# Patient Record
Sex: Male | Born: 1990 | Race: Black or African American | Hispanic: No | Smoking: Current some day smoker
Health system: Southern US, Community
[De-identification: ages and names within clinical notes are randomized; demographics above are authoritative.]

---

## 2015-02-21 ENCOUNTER — Emergency Department (HOSPITAL_COMMUNITY): Payer: Self-pay

## 2015-02-21 ENCOUNTER — Encounter (HOSPITAL_COMMUNITY): Payer: Self-pay | Admitting: *Deleted

## 2015-02-21 ENCOUNTER — Observation Stay (HOSPITAL_COMMUNITY): Payer: Self-pay

## 2015-02-21 ENCOUNTER — Emergency Department (HOSPITAL_COMMUNITY): Payer: MEDICAID

## 2015-02-21 ENCOUNTER — Inpatient Hospital Stay (HOSPITAL_COMMUNITY)
Admission: EM | Admit: 2015-02-21 | Discharge: 2015-02-27 | DRG: 089 | Disposition: A | Discharge status: Other Acute Inpatient Hospital | Payer: Self-pay | Attending: General Surgery | Admitting: General Surgery

## 2015-02-21 DIAGNOSIS — M542 Cervicalgia: Secondary | ICD-10-CM

## 2015-02-21 DIAGNOSIS — R402362 Coma scale, best motor response, obeys commands, at arrival to emergency department: Secondary | ICD-10-CM | POA: Diagnosis present

## 2015-02-21 DIAGNOSIS — S0990XA Unspecified injury of head, initial encounter: Secondary | ICD-10-CM

## 2015-02-21 DIAGNOSIS — S81012A Laceration without foreign body, left knee, initial encounter: Secondary | ICD-10-CM | POA: Diagnosis present

## 2015-02-21 DIAGNOSIS — S20211A Contusion of right front wall of thorax, initial encounter: Secondary | ICD-10-CM

## 2015-02-21 DIAGNOSIS — R52 Pain, unspecified: Secondary | ICD-10-CM

## 2015-02-21 DIAGNOSIS — Y92410 Unspecified street and highway as the place of occurrence of the external cause: Secondary | ICD-10-CM

## 2015-02-21 DIAGNOSIS — F333 Major depressive disorder, recurrent, severe with psychotic symptoms: Secondary | ICD-10-CM | POA: Diagnosis present

## 2015-02-21 DIAGNOSIS — S71112A Laceration without foreign body, left thigh, initial encounter: Secondary | ICD-10-CM | POA: Diagnosis present

## 2015-02-21 DIAGNOSIS — S060XAA Concussion with loss of consciousness status unknown, initial encounter: Secondary | ICD-10-CM | POA: Diagnosis present

## 2015-02-21 DIAGNOSIS — S060X9A Concussion with loss of consciousness of unspecified duration, initial encounter: Principal | ICD-10-CM | POA: Diagnosis present

## 2015-02-21 DIAGNOSIS — T1491 Suicide attempt: Secondary | ICD-10-CM | POA: Diagnosis present

## 2015-02-21 DIAGNOSIS — R402142 Coma scale, eyes open, spontaneous, at arrival to emergency department: Secondary | ICD-10-CM | POA: Diagnosis present

## 2015-02-21 DIAGNOSIS — T1491XA Suicide attempt, initial encounter: Secondary | ICD-10-CM | POA: Diagnosis present

## 2015-02-21 DIAGNOSIS — F17210 Nicotine dependence, cigarettes, uncomplicated: Secondary | ICD-10-CM | POA: Diagnosis present

## 2015-02-21 DIAGNOSIS — S81812A Laceration without foreign body, left lower leg, initial encounter: Secondary | ICD-10-CM | POA: Diagnosis present

## 2015-02-21 DIAGNOSIS — R402232 Coma scale, best verbal response, inappropriate words, at arrival to emergency department: Secondary | ICD-10-CM | POA: Diagnosis present

## 2015-02-21 LAB — COMPREHENSIVE METABOLIC PANEL
ALBUMIN: 4 g/dL (ref 3.5–5.0)
ALT: 73 U/L — AB (ref 17–63)
AST: 140 U/L — ABNORMAL HIGH (ref 15–41)
Alkaline Phosphatase: 63 U/L (ref 38–126)
Anion gap: 22 — ABNORMAL HIGH (ref 5–15)
BUN: 5 mg/dL — AB (ref 6–20)
CALCIUM: 9.1 mg/dL (ref 8.9–10.3)
CO2: 14 mmol/L — ABNORMAL LOW (ref 22–32)
Chloride: 100 mmol/L — ABNORMAL LOW (ref 101–111)
Creatinine, Ser: 1.22 mg/dL (ref 0.61–1.24)
GFR calc Af Amer: >60 mL/min (ref >60)
Glucose, Bld: 215 mg/dL — ABNORMAL HIGH (ref 65–99)
Potassium: 4.2 mmol/L (ref 3.5–5.1)
Sodium: 136 mmol/L (ref 135–145)
Total Bilirubin: 1 mg/dL (ref 0.3–1.2)
Total Protein: 6.7 g/dL (ref 6.5–8.1)

## 2015-02-21 LAB — PREPARE FRESH FROZEN PLASMA
Unit division: 0
Unit division: 0

## 2015-02-21 LAB — TYPE AND SCREEN
ABO/RH(D): A POS
ANTIBODY SCREEN: NEGATIVE
Unit division: 0
Unit division: 0

## 2015-02-21 LAB — I-STAT CHEM 8, ED
BUN: 4 mg/dL — ABNORMAL LOW (ref 6–20)
Calcium, Ion: 1.12 mmol/L (ref 1.12–1.23)
Chloride: 98 mmol/L — ABNORMAL LOW (ref 101–111)
Creatinine, Ser: 1 mg/dL (ref 0.61–1.24)
Glucose, Bld: 212 mg/dL — ABNORMAL HIGH (ref 65–99)
HEMATOCRIT: 53 % — AB (ref 39.0–52.0)
Hemoglobin: 18 g/dL — ABNORMAL HIGH (ref 13.0–17.0)
Potassium: 3.9 mmol/L (ref 3.5–5.1)
Sodium: 136 mmol/L (ref 135–145)
TCO2: 18 mmol/L (ref 0–100)

## 2015-02-21 LAB — ABO/RH: ABO/RH(D): A POS

## 2015-02-21 LAB — CBC
HCT: 46.9 % (ref 39.0–52.0)
HEMOGLOBIN: 16.3 g/dL (ref 13.0–17.0)
MCH: 29.3 pg (ref 26.0–34.0)
MCHC: 34.8 g/dL (ref 30.0–36.0)
MCV: 84.2 fL (ref 78.0–100.0)
PLATELETS: 177 10*3/uL (ref 150–400)
RBC: 5.57 MIL/uL (ref 4.22–5.81)
RDW: 12.8 % (ref 11.5–15.5)
WBC: 7.6 10*3/uL (ref 4.0–10.5)

## 2015-02-21 LAB — CDS SEROLOGY

## 2015-02-21 LAB — PROTIME-INR
INR: 1.22 (ref 0.00–1.49)
Prothrombin Time: 15.6 seconds — ABNORMAL HIGH (ref 11.6–15.2)

## 2015-02-21 LAB — I-STAT CG4 LACTIC ACID, ED: Lactic Acid, Venous: 10.38 mmol/L (ref 0.5–2.0)

## 2015-02-21 LAB — ETHANOL: ALCOHOL ETHYL (B): 37 mg/dL — AB (ref <5)

## 2015-02-21 MED ORDER — LIDOCAINE HCL 1 % IJ SOLN
20.0000 mL | Freq: Once | INTRAMUSCULAR | Status: AC
  Administered 2015-02-21: 20 mL
  Filled 2015-02-21 (×2): qty 20

## 2015-02-21 MED ORDER — FENTANYL CITRATE (PF) 100 MCG/2ML IJ SOLN
INTRAMUSCULAR | Status: AC
  Filled 2015-02-21: qty 2

## 2015-02-21 MED ORDER — BACITRACIN ZINC 500 UNIT/GM EX OINT
TOPICAL_OINTMENT | Freq: Two times a day (BID) | CUTANEOUS | Status: DC
  Administered 2015-02-21: 23:00:00 via TOPICAL
  Filled 2015-02-21 (×2): qty 28.35

## 2015-02-21 MED ORDER — SODIUM CHLORIDE 0.45 % IV SOLN
INTRAVENOUS | Status: DC
  Administered 2015-02-21: 75 mL/h via INTRAVENOUS
  Administered 2015-02-21: 17:00:00 via INTRAVENOUS

## 2015-02-21 MED ORDER — FENTANYL CITRATE (PF) 100 MCG/2ML IJ SOLN
INTRAMUSCULAR | Status: AC | PRN
  Administered 2015-02-21: 50 ug via INTRAVENOUS

## 2015-02-21 MED ORDER — ONDANSETRON HCL 4 MG/2ML IJ SOLN
INTRAMUSCULAR | Status: AC
  Filled 2015-02-21: qty 2

## 2015-02-21 MED ORDER — ONDANSETRON HCL 4 MG/2ML IJ SOLN
4.0000 mg | Freq: Once | INTRAMUSCULAR | Status: AC
  Administered 2015-02-21: 4 mg via INTRAVENOUS

## 2015-02-21 MED ORDER — IOHEXOL 300 MG/ML  SOLN
150.0000 mL | Freq: Once | INTRAMUSCULAR | Status: AC | PRN
  Administered 2015-02-21: 130 mL via INTRAVENOUS

## 2015-02-21 MED ORDER — ENOXAPARIN SODIUM 40 MG/0.4ML ~~LOC~~ SOLN
40.0000 mg | SUBCUTANEOUS | Status: DC
  Administered 2015-02-21 – 2015-02-26 (×4): 40 mg via SUBCUTANEOUS
  Filled 2015-02-21 (×5): qty 0.4

## 2015-02-21 MED ORDER — HYDROCODONE-ACETAMINOPHEN 10-325 MG PO TABS
0.5000 | ORAL_TABLET | ORAL | Status: DC | PRN
  Administered 2015-02-21 (×2): 1 via ORAL
  Administered 2015-02-22 (×2): 2 via ORAL
  Administered 2015-02-22: 1 via ORAL
  Administered 2015-02-23 – 2015-02-24 (×2): 2 via ORAL
  Filled 2015-02-21: qty 1
  Filled 2015-02-21 (×3): qty 2
  Filled 2015-02-21: qty 1
  Filled 2015-02-21 (×2): qty 2
  Filled 2015-02-21: qty 1

## 2015-02-21 MED ORDER — TETANUS-DIPHTH-ACELL PERTUSSIS 5-2.5-18.5 LF-MCG/0.5 IM SUSP
0.5000 mL | Freq: Once | INTRAMUSCULAR | Status: AC
Start: 2015-02-21 — End: 2015-02-21
  Administered 2015-02-21: 0.5 mL via INTRAMUSCULAR
  Filled 2015-02-21: qty 0.5

## 2015-02-21 MED ORDER — ONDANSETRON HCL 4 MG PO TABS
4.0000 mg | ORAL_TABLET | Freq: Four times a day (QID) | ORAL | Status: DC | PRN
  Administered 2015-02-22: 4 mg via ORAL
  Filled 2015-02-21: qty 1

## 2015-02-21 MED ORDER — MORPHINE SULFATE 2 MG/ML IJ SOLN
INTRAMUSCULAR | Status: AC
  Filled 2015-02-21: qty 1

## 2015-02-21 MED ORDER — FENTANYL CITRATE (PF) 100 MCG/2ML IJ SOLN
INTRAMUSCULAR | Status: AC
Start: 2015-02-21 — End: 2015-02-21
  Filled 2015-02-21: qty 2

## 2015-02-21 MED ORDER — MIDAZOLAM HCL 2 MG/2ML IJ SOLN
INTRAMUSCULAR | Status: AC
  Filled 2015-02-21: qty 4

## 2015-02-21 MED ORDER — MIDAZOLAM HCL 5 MG/5ML IJ SOLN
INTRAMUSCULAR | Status: AC | PRN
  Administered 2015-02-21: 2 mg via INTRAVENOUS

## 2015-02-21 MED ORDER — SODIUM CHLORIDE 0.45 % IV SOLN
INTRAVENOUS | Status: DC
  Administered 2015-02-21: 14:00:00 via INTRAVENOUS
  Filled 2015-02-21 (×3): qty 1000

## 2015-02-21 MED ORDER — ONDANSETRON HCL 4 MG/2ML IJ SOLN: 4.0000 mg | Freq: Four times a day (QID) | INTRAMUSCULAR | Status: DC | PRN

## 2015-02-21 MED ORDER — MORPHINE SULFATE 2 MG/ML IJ SOLN
2.0000 mg | INTRAMUSCULAR | Status: DC | PRN
  Administered 2015-02-21: 2 mg via INTRAVENOUS
  Filled 2015-02-21: qty 1

## 2015-02-21 NOTE — Progress Notes (Signed)
Patient refusing IV fluid at this time and refusing Aspen Collar. Educated patient on reasons behind the orders but he politely reclines. Will cont to monitor

## 2015-02-21 NOTE — ED Notes (Signed)
Restricted extremity band placed on left wrist. Ice pack placed on pt's left upper arm. Left arm elevated with blankets.
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
## 2015-02-21 NOTE — Progress Notes (Signed)
PT REMOVED ASPEN BRACE HIMSELF, ADVISED AGAINST REMOVAL, EXPLAINED RATIONAL FOR PLACEMENT, REFUSED TO REAPPLY DEVICE.

## 2015-02-21 NOTE — ED Notes (Signed)
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
## 2015-02-21 NOTE — ED Notes (Signed)
Ice removed.

## 2015-02-21 NOTE — ED Notes (Signed)
MD suturing lacerations
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
## 2015-02-21 NOTE — ED Notes (Signed)
Pharmacy called for lidocaine
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
## 2015-02-21 NOTE — ED Notes (Signed)
Approx 80 ML of IV contrast infiltrated to L arm.  MD aware.
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
## 2015-02-21 NOTE — ED Notes (Signed)
Peripheral IV in Left AC removed per Steward Drone RN.

## 2015-02-22 ENCOUNTER — Inpatient Hospital Stay (HOSPITAL_COMMUNITY): Payer: Self-pay

## 2015-02-22 LAB — BLOOD PRODUCT ORDER (VERBAL) VERIFICATION

## 2015-02-22 MED ORDER — ONDANSETRON HCL 4 MG/2ML IJ SOLN: 4.0000 mg | Freq: Once | INTRAMUSCULAR | Status: DC

## 2015-02-22 MED ORDER — POTASSIUM CHLORIDE IN NACL 20-0.9 MEQ/L-% IV SOLN
INTRAVENOUS | Status: DC
  Filled 2015-02-22: qty 1000

## 2015-02-22 MED ORDER — PROMETHAZINE HCL 25 MG/ML IJ SOLN: 12.5000 mg | Freq: Four times a day (QID) | INTRAMUSCULAR | Status: DC | PRN

## 2015-02-22 MED ORDER — SODIUM CHLORIDE 0.9 % IV SOLN
8.0000 mg | Freq: Once | INTRAVENOUS | Status: AC
  Filled 2015-02-22: qty 4

## 2015-02-22 MED ORDER — ONDANSETRON HCL 4 MG/2ML IJ SOLN
4.0000 mg | Freq: Once | INTRAMUSCULAR | Status: AC
  Administered 2015-02-22: 4 mg via INTRAVENOUS
  Filled 2015-02-22: qty 2

## 2015-02-22 MED ORDER — BACITRACIN-NEOMYCIN-POLYMYXIN OINTMENT TUBE
1.0000 "application " | TOPICAL_OINTMENT | Freq: Every day | CUTANEOUS | Status: DC
  Administered 2015-02-22 – 2015-02-26 (×4): 1 via TOPICAL
  Filled 2015-02-22: qty 15
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
## 2015-02-23 DIAGNOSIS — R45851 Suicidal ideations: Secondary | ICD-10-CM

## 2015-02-23 DIAGNOSIS — F333 Major depressive disorder, recurrent, severe with psychotic symptoms: Secondary | ICD-10-CM | POA: Diagnosis present

## 2015-02-23 LAB — COMPREHENSIVE METABOLIC PANEL
ALT: 43 U/L (ref 17–63)
AST: 67 U/L — ABNORMAL HIGH (ref 15–41)
Albumin: 3.4 g/dL — ABNORMAL LOW (ref 3.5–5.0)
Alkaline Phosphatase: 54 U/L (ref 38–126)
Anion gap: 9 (ref 5–15)
BILIRUBIN TOTAL: 0.9 mg/dL (ref 0.3–1.2)
BUN: 8 mg/dL (ref 6–20)
CALCIUM: 9 mg/dL (ref 8.9–10.3)
CO2: 28 mmol/L (ref 22–32)
Chloride: 98 mmol/L — ABNORMAL LOW (ref 101–111)
Creatinine, Ser: 0.9 mg/dL (ref 0.61–1.24)
GLUCOSE: 98 mg/dL (ref 65–99)
Potassium: 3.8 mmol/L (ref 3.5–5.1)
Sodium: 135 mmol/L (ref 135–145)
Total Protein: 6.4 g/dL — ABNORMAL LOW (ref 6.5–8.1)

## 2015-02-23 LAB — CBC
HCT: 43.1 % (ref 39.0–52.0)
Hemoglobin: 14.8 g/dL (ref 13.0–17.0)
MCH: 29.3 pg (ref 26.0–34.0)
MCHC: 34.3 g/dL (ref 30.0–36.0)
MCV: 85.3 fL (ref 78.0–100.0)
PLATELETS: 102 10*3/uL — AB (ref 150–400)
RBC: 5.05 MIL/uL (ref 4.22–5.81)
RDW: 13.2 % (ref 11.5–15.5)
WBC: 9.1 10*3/uL (ref 4.0–10.5)
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
## 2015-02-23 NOTE — Clinical Social Work Note (Signed)
Clinical Social Work Assessment  Patient Details  Name: Frank [name] MRN: [id number] Date of Birth: 1991-04-14  Date of referral:  02/21/15               Reason for consult:  Trauma, Suicide Risk/Attempt                Permission sought to share information with:    Permission granted to share information::  No  Name::        Agency::     Relationship::     Contact Information:     Housing/Transportation Living arrangements for the past 2 months:  Apartment Source of Information:  Patient Patient Interpreter Needed:  None Criminal Activity/Legal Involvement Pertinent to Current Situation/Hospitalization:  No - Comment as needed Significant Relationships:  None Lives with:  Roommate Do you feel safe going back to the place where you live?  Yes Need for family participation in patient care:  No (Coment)  Care giving concerns: CSW met with patient who was very guarded. Patient stated that he feels things have gotten to the place where there is nothing that (me the social worker or anyone else) can help. Patient stated that he was not going to eat or drink anymore. CSW attempted to assess this further and patient became unclear whether this was a choice or lack of appetite. CSW attempted to assess alcohol use (SBIRT) patient was not clear about the details of his drinking but states that he drinks when he gets stressed.    Social Worker assessment / plan: CSW was able to get only limited information about patient needs. CSW will follow needs/recommendations/plan from psychiatry.   Employment status:  Unemployed Forensic scientist:  Self Pay (Medicaid Pending) PT Recommendations:  Not assessed at this time Information / Referral to community resources:     Patient/Family's Response to care: Patient is very ambivalent about whether or not receives care and presents as though he does not want care and is refusing to eat. RN did identify that he has been compliant this afternoon.  Patient in with sitter for safety.   Patient/Family's Understanding of and Emotional Response to Diagnosis, Current Treatment, and Prognosis: Patient is stating that he has little or no support. Patient is unclear about how he f3els about his current treatment and diagnosis at this time.   Emotional Assessment Appearance:  Appears older than stated age Attitude/Demeanor/Rapport:  Avoidant, Guarded Affect (typically observed):  Apprehensive, Flat, Guarded, Hopeless Orientation:  Oriented to Self, Oriented to Place, Oriented to  Time, Oriented to Situation Alcohol / Substance use:  Alcohol Use (Patient could not clearly identify how much he was drinking. Stated "alcohol contact" rather than number of drinks) Psych involvement (Current and /or in the community):  Yes (Comment) (Psychiatry has come to consult)  Discharge Needs  Concerns to be addressed:  Cognitive Concerns, Denies Needs/Concerns at this time, Lack of Support, Substance Abuse Concerns Readmission within the last 30 days:  No Current discharge risk:  Psychiatric Illness Barriers to Discharge:      Christene Lye, LCSW 02/23/2015, 4:34 PM
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
## 2015-02-23 NOTE — Progress Notes (Signed)
PT Cancellation Note  Patient Details Name: Frank [name] MRN: [id number] DOB: 1990-09-09   Cancelled Treatment:    Reason Eval/Treat Not Completed: Fatigue/lethargy limiting ability to participate.  Attempted to see patient x2 today.  Patient declined on both occasions.  Will return tomorrow.   [name] 02/23/2015, 7:39 PM [name]. [name], [facility] Acute Rehab Services Pager [phone]

## 2015-02-23 NOTE — Progress Notes (Signed)
Called to room by Recruitment consultant. Patient is "sticking fingers down throat trying to make himself throw up".

## 2015-02-24 MED ORDER — BACITRACIN-NEOMYCIN-POLYMYXIN OINTMENT TUBE: 1.0000 "application " | TOPICAL_OINTMENT | Freq: Every day | CUTANEOUS | Status: AC

## 2015-02-24 MED ORDER — RISPERIDONE 0.5 MG PO TABS
0.5000 mg | ORAL_TABLET | Freq: Every day | ORAL | Status: DC
  Administered 2015-02-24 – 2015-02-25 (×2): 0.5 mg via ORAL
  Filled 2015-02-24 (×2): qty 1
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
## 2015-02-24 NOTE — Progress Notes (Signed)
Patient was in bathroom. Patient found standing on bedside commode with a pillow case ripped and tied into a noose. He broke the shower curtain and was about to hang the noose on a hook of the shower rod. When found he was carefully brought down by staff and placed back in bed. No injuries. Sitter and staff educated patient cannot be alone in bathroom. Will continue to monitor.
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
## 2015-02-25 MED ORDER — SODIUM CHLORIDE 0.9 % IJ SOLN: 3.0000 mL | Freq: Two times a day (BID) | INTRAMUSCULAR | Status: DC

## 2015-02-25 MED ORDER — SODIUM CHLORIDE 0.9 % IJ SOLN: 3.0000 mL | INTRAMUSCULAR | Status: DC | PRN

## 2015-02-25 MED ORDER — SODIUM CHLORIDE 0.9 % IV SOLN: 250.0000 mL | INTRAVENOUS | Status: DC | PRN

## 2015-02-25 NOTE — Clinical Social Work Psych Note (Signed)
Psych CSW attempted assessment. Patient sleeping soundly.  Psych CSW spoke with RN and sitter who provided updated information regarding patient's current presentation.  Psych CSW continued attempts to contact family.  Patient's mother was a patient at [facility], [facility].  Of record she lives at [address] [place].  Per report from Trauma CSW, on 02/21/2015, a call was made to [employer] to request a courtesy visit to inform mother of patient's admission.  No success.  Psych CSW found two apartments on [address] [phone]) and Vedia Pereyra [phone]) neither of which own the apartment at [address]  Per [employer], [address] is privately owned.  Psych CSW reviewed mother's discharge instructions from a recent admission from [facility] and noticed a follow-up appointment with Dr. Charna Elizabeth.  Psych CSW contacted provider's office who state the mother was never set up as a patient.  Psych CSW has exhausted attempts to locate family for this patient.  Psych CSW will continue to assist with discharge plans and placement.  Psychiatrist spoke with [facility], Minerva Areola over the weekend who stated a bed will be provided for the patient.  AC today, Inetta Fermo, states that [facility] is full, but are expecting discharges tomorrow where patient will be reviewed for admission.  Trauma RNCM and CSW both updated.  Vickii Penna, LCSW [phone]  Psychiatric & Orthopedics (5N 1-8) Clinical Social Worker
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
## 2015-02-26 DIAGNOSIS — S81812A Laceration without foreign body, left lower leg, initial encounter: Secondary | ICD-10-CM | POA: Diagnosis present

## 2015-02-26 MED ORDER — BENZTROPINE MESYLATE 0.5 MG PO TABS: 0.5000 mg | ORAL_TABLET | Freq: Two times a day (BID) | ORAL | Status: DC | PRN

## 2015-02-26 MED ORDER — RISPERIDONE 0.5 MG PO TABS: 1.0000 mg | ORAL_TABLET | Freq: Every day | ORAL | Status: DC
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
## 2015-02-27 ENCOUNTER — Inpatient Hospital Stay (HOSPITAL_COMMUNITY)
Admission: AD | Admit: 2015-02-27 | Discharge: 2015-03-06 | DRG: 885 | Disposition: A | Discharge status: Home / Self Care | Payer: Federal, State, Local not specified - Other | Source: Intra-hospital | Attending: Psychiatry | Admitting: Psychiatry

## 2015-02-27 ENCOUNTER — Encounter (HOSPITAL_COMMUNITY): Payer: Self-pay | Admitting: Behavioral Health

## 2015-02-27 DIAGNOSIS — G4700 Insomnia, unspecified: Secondary | ICD-10-CM | POA: Diagnosis present

## 2015-02-27 DIAGNOSIS — F200 Paranoid schizophrenia: Secondary | ICD-10-CM | POA: Diagnosis present

## 2015-02-27 DIAGNOSIS — R45851 Suicidal ideations: Secondary | ICD-10-CM | POA: Diagnosis present

## 2015-02-27 DIAGNOSIS — T1491XA Suicide attempt, initial encounter: Secondary | ICD-10-CM | POA: Diagnosis present

## 2015-02-27 DIAGNOSIS — F22 Delusional disorders: Secondary | ICD-10-CM | POA: Diagnosis present

## 2015-02-27 DIAGNOSIS — F209 Schizophrenia, unspecified: Principal | ICD-10-CM | POA: Diagnosis present

## 2015-02-27 DIAGNOSIS — F411 Generalized anxiety disorder: Secondary | ICD-10-CM | POA: Diagnosis present

## 2015-02-27 DIAGNOSIS — Z87891 Personal history of nicotine dependence: Secondary | ICD-10-CM

## 2015-02-27 DIAGNOSIS — Z9114 Patient's other noncompliance with medication regimen: Secondary | ICD-10-CM | POA: Diagnosis present

## 2015-02-27 MED ORDER — BENZTROPINE MESYLATE 0.5 MG PO TABS
0.5000 mg | ORAL_TABLET | Freq: Two times a day (BID) | ORAL | Status: DC
  Administered 2015-03-02 (×2): 0.5 mg via ORAL
  Filled 2015-02-27 (×15): qty 1

## 2015-02-27 MED ORDER — BENZTROPINE MESYLATE 0.5 MG PO TABS: 0.5000 mg | ORAL_TABLET | Freq: Two times a day (BID) | ORAL | Status: DC | PRN

## 2015-02-27 MED ORDER — ALUM & MAG HYDROXIDE-SIMETH 200-200-20 MG/5ML PO SUSP: 30.0000 mL | ORAL | Status: DC | PRN

## 2015-02-27 MED ORDER — MAGNESIUM HYDROXIDE 400 MG/5ML PO SUSP: 30.0000 mL | Freq: Every day | ORAL | Status: DC | PRN

## 2015-02-27 MED ORDER — RISPERIDONE 1 MG PO TABS: 1.0000 mg | ORAL_TABLET | Freq: Every day | ORAL | Status: DC

## 2015-02-27 MED ORDER — RISPERIDONE 0.5 MG PO TABS
0.5000 mg | ORAL_TABLET | Freq: Two times a day (BID) | ORAL | Status: DC
  Filled 2015-02-27 (×7): qty 1

## 2015-02-27 MED ORDER — HYDROXYZINE HCL 25 MG PO TABS
25.0000 mg | ORAL_TABLET | Freq: Four times a day (QID) | ORAL | Status: DC | PRN
  Administered 2015-02-28: 25 mg via ORAL
  Filled 2015-02-27: qty 30
  Filled 2015-02-27: qty 1

## 2015-02-27 MED ORDER — TRAZODONE HCL 50 MG PO TABS: 50.0000 mg | ORAL_TABLET | Freq: Every evening | ORAL | Status: DC | PRN

## 2015-02-27 MED ORDER — ACETAMINOPHEN 325 MG PO TABS
650.0000 mg | ORAL_TABLET | Freq: Four times a day (QID) | ORAL | Status: DC | PRN
  Administered 2015-02-28: 650 mg via ORAL
  Filled 2015-02-27: qty 2
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
## 2015-02-27 NOTE — Progress Notes (Signed)
Patient transferred to [facility] by [facility] discharge summary and IVC orders were given to officers for transport, patient stable and cooperative, report was called to [facility].

## 2015-02-27 NOTE — Tx Team (Signed)
Initial Interdisciplinary Treatment Plan   PATIENT STRESSORS: pt denies any stressors    PATIENT STRENGTHS: Capable of independent living Supportive family/friends   PROBLEM LIST: Problem List/Patient Goals Date to be addressed Date deferred Reason deferred Estimated date of resolution  "none"                                                       DISCHARGE CRITERIA:  Ability to meet basic life and health needs Adequate post-discharge living arrangements Improved stabilization in mood, thinking, and/or behavior  PRELIMINARY DISCHARGE PLAN: Attend aftercare/continuing care group Attend PHP/IOP  PATIENT/FAMIILY INVOLVEMENT: This treatment plan has been presented to and reviewed with the patient, Isay Curran, and/or family member.  The patient and family have been given the opportunity to ask questions and make suggestions.  Latacha Texeira L 02/27/2015, 4:05 PM

## 2015-02-27 NOTE — Progress Notes (Signed)
Attempted to call report to [facility] was told they would call me back, waiting on call back.

## 2015-02-27 NOTE — Clinical Social Work Psych Note (Signed)
Patient was accepted to [facility] bed [id number].  Patient will be transported via [employer].  RN and MD made aware.  [employer] called and transportation arranged.  IVC paperwork on the chart.  Psych CSW verified opinions were all up-to-date and patient has been served (02/24/2015).    RN to call report to [phone]  Vickii Penna, LCSW [phone]  Psychiatric & Orthopedics (5N 1-8) Clinical Social Worker

## 2015-02-27 NOTE — Progress Notes (Signed)
PT Cancellation Note  Patient Details Name: Frank [name] MRN: [id number] DOB: [birth year]   Cancelled Treatment:    Reason Eval/Treat Not Completed: Patient declined, no reason specified . Patient refused PT today as we could not take him outside. Patient then became agitated and stated it was pointless for him to keep walking around unit. Patient has been mobilizing on his own. At this time patient is refusing therapy. Will sign off on acute PT.   Fredrich Birks 02/27/2015, 10:11 AM
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
## 2015-02-27 NOTE — Clinical Social Work Psych Note (Signed)
Patient is currently [employer].  Completed by MD Tsuei on 02/24/2015.  RN and PA updated and aware.  Vickii Penna, LCSW [phone]  Psychiatric & Orthopedics (5N 1-8) Clinical Social Worker

## 2015-02-27 NOTE — Progress Notes (Signed)
Admission note: Pt presents tangential on approach with disorganized thoughts. Pt childlike and silly during admission process. Pt appears to be responding to internal stimuli but denies any hallucinations on admit. Pt stated past hx of visual hallucinations about a year ago. Pt minimizing symptoms and denies any suicidal/homicidal thoughts. Pt denies AVH. Pt noted to have three lacerations to his left leg with staples intact. Laceration #1 8 staples, #2- 4 staples and #3- 11 staples. Pt speaks "sarah" language but understands some english. Pt reported that he's currently living alone in [facility]. Pt is currently a Consulting civil engineer at [employer] Pt reported that he plans to return to his parents home once he's discharge.   Pt skin assessed, belongings searched, v/s taken and required documents signed.

## 2015-02-28 ENCOUNTER — Encounter (HOSPITAL_COMMUNITY): Payer: Self-pay | Admitting: Psychiatry

## 2015-02-28 DIAGNOSIS — F200 Paranoid schizophrenia: Secondary | ICD-10-CM | POA: Diagnosis present

## 2015-02-28 MED ORDER — OLANZAPINE 5 MG PO TBDP
5.0000 mg | ORAL_TABLET | Freq: Every day | ORAL | Status: DC
  Administered 2015-03-02: 5 mg via ORAL
  Filled 2015-02-28 (×8): qty 1

## 2015-02-28 NOTE — [facility] Group Notes (Signed)
[facility] Group Notes:  (Counselor/Nursing/MHT/Case Management/Adjunct)  02/28/2015 1:15PM  Type of Therapy:  Group Therapy  Participation Level:  Active  Participation Quality:  Appropriate  Affect:  Flat  Cognitive:  Oriented  Insight:  Improving  Engagement in Group:  Limited  Engagement in Therapy:  Limited  Modes of Intervention:  Discussion, Exploration and Socialization  Summary of Progress/Problems: The topic for group was balance in life.  Pt participated in the discussion about when their life was in balance and out of balance and how this feels.  Pt discussed ways to get back in balance and short term goals they can work on to get where they want to be. Stayed the entire time, and was engaged throughout.  Stated he feels balanced today, and knows he is because "I am now thinking differently, and I feel in control.  That wasn't me before."  Talked about how listening to music and getting exercise are 2 things that help him feel balanced, as well as staying active.  Goal directed throughout.  Is focused on getting into shelter after checking in with parents, and then going to a temp agency to find work.  Gave positive feedback to others.   Ida Rogue 02/28/2015 3:25 PM

## 2015-02-28 NOTE — Progress Notes (Signed)
D: Pt visible in dayroom at time of scheduled group this AM when prompted and in milieu at brief intervals during shift. Pt denies SI, HI, AVH and pain but appeared to be preoccupied and responding to internal internal stimuli at time of shift assessment. Pt presents with anxious affect and mood. Remains delusional in thought process, believes he's been controlled by someone else, told writer "this is not me, someone is doing this, but you will not understand". Pt rated his depression 7/10, hopelessness 7/10 and anxiety 7/10. Staples intact to left leg--(4, 8, 11), site dry and intact without s/s of infection.  A: All scheduled medications offered as per MD's orders and pt refused. Verbal education provided to increase insight and promote compliance with treatment regimen but to no avail. PRN Tylenol and PRN Vistaril administered as per pt's request for c/o pain and anxiety and was effective. Pt informed of new order for Zyprexa Zydis 5 mg PO BID and replied to writer "the doctor told me I have the right to refuse it".   Emotional support and encouragement provided to this pt. Q 15 minutes checks maintained for safety and pt monitored as such without behavioral outburst or self injurious behavior to note at present.  R: Pt remains safe on unit. Tolerated PRN medications well when offered. Continue plan of care.
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
## 2015-02-28 NOTE — Plan of Care (Signed)
Problem: Diagnosis: Increased Risk For Suicide Attempt Goal: STG-Patient Will Comply With Medication Regime Outcome: Not Progressing Pt is noncompliant with his medications as prescribed, despite verbal education to increase insight and promote compliance. Pt has not been verbally or physically aggressive thus far this shift.

## 2015-02-28 NOTE — BHH Counselor (Signed)
Adult Comprehensive Assessment  Patient ID: Frank [name], male   DOB: 11/06/1990, 24 y.o.   MRN: [id number]  Information Source: Information source: Patient  Current Stressors:  Educational / Learning stressors: was a Consulting civil engineer at [employer] / Job issues: Veterinary surgeon / Lack of resources (include bankruptcy): No income.  Was most recently living on student loan, but he is out Housing / Lack of housing: Homeless Substance abuse: Denies  "I used to drink beer."  Living/Environment/Situation:  Living Arrangements: Alone How long has patient lived in current situation?: Was living in a boarding house for the last month.  Prior to that was staying with a friend for almost a year.  What is atmosphere in current home: Temporary  Family History:  Marital status: Single Does patient have children?: No  Childhood History:  By whom was/is the patient raised?: Both parents Additional childhood history information: Came to [place] from [place] when he was 34. Description of patient's relationship with caregiver when they were a child: good Patient's description of current relationship with people who raised him/her: good Does patient have siblings?: Yes Number of Siblings: 3 Description of patient's current relationship with siblings: Brother in [place], 2 sisters at home with parents Did patient suffer any verbal/emotional/physical/sexual abuse as a child?: No Did patient suffer from severe childhood neglect?: No Has patient ever been sexually abused/assaulted/raped as an adolescent or adult?: No Was the patient ever a victim of a crime or a disaster?: No Witnessed domestic violence?: No Has patient been effected by domestic violence as an adult?: No  Education:  Highest grade of school patient has completed: 12 plus some community college Currently a Consulting civil engineer?: No Learning disability?: No  Employment/Work Situation:   Employment situation: Unemployed Patient's job has been impacted  by current illness: No What is the longest time patient has a held a job?: Couple of months 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 Has patient ever been in the [employer]?: No Has patient ever served in Buyer, retail?: No  Financial Resources:   Surveyor, quantity resources: No income Does patient have a Lawyer or guardian?: No  Alcohol/Substance Abuse:   What has been your use of drugs/alcohol within the last 12 months?: "I used to drink beer." If attempted suicide, did drugs/alcohol play a role in this?: No Alcohol/Substance Abuse Treatment Hx: Denies past history Has alcohol/substance abuse ever caused legal problems?: No  Social Support System:   Conservation officer, nature Support System: Fair Development worker, community Support System: Parents Type of faith/religion: Ephriam Knuckles How does patient's faith help to cope with current illness?: "It's important to me"  Leisure/Recreation:   Leisure and Hobbies: Listening to music on [employer]  Strengths/Needs:   What things does the patient do well?: "Anything I put my mind to." In what areas does patient struggle / problems for patient: "Thinking about going to the shelter."  Discharge Plan:   Does patient have access to transportation?: Yes Will patient be returning to same living situation after discharge?: No Plan for living situation after discharge: Says he will check in with his parents, then probably go to  Currently receiving community mental health services: No If no, would patient like referral for services when discharged?: Yes (What county?) Medical sales representative) Does patient have financial barriers related to discharge medications?: Yes Patient description of barriers related to discharge medications: No income, no insurance  Summary/Recommendations:   Summary and Recommendations (to be completed by the evaluator): Frank [name] is a 24 YO [place] immigrant who is here  because of SI, which he denies today.  "When I said those things before, it  wasn't me.  I am now in control of myself, and I would never want to hurt myself."  He states that he used to drink beer to help himself fall asleep because of racing thoughts.  While they still exist, he does not want to take meds, and states that he knows he can refuse them here.  Finds himself without income and homeless, but feels optimistic that he will be able to stay at the shelter after checking in with his parents, and get a job so that he can get his own place.  He can benefit from crises stabilization, medication management if he chooses to avail himself to that , therapeutic milieu and referral for services.  Daryel Gerald B. 02/28/2015

## 2015-02-28 NOTE — BHH Group Notes (Signed)
BHH Group Notes:  (Nursing--Leisure and Lifestyle Changes)  Date:  02/28/2015  Time:  0930  Type of Therapy:  Psychoeducational Skills  Participation Level:  Minimal and active  Participation Quality:  Appropriate, Attentive and Sharing  Affect:  Anxious  Cognitive:  Alert, Disorganized and Confused  Insight:  Limited  Engagement in Group:  Engaged, Limited and Supportive  Modes of Intervention:  Discussion, Education, Orientation, Rapport Building and Support  Summary of Progress/Problems: Pt engaged in group. Stayed in session throughout and was cooperative in group.   Ouida Sills, Lincoln Maxin 02/28/2015, 0930
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
## 2015-02-28 NOTE — [facility] Suicide Risk Assessment (Signed)
[facility] INPATIENT:  Family/Significant Other Suicide Prevention Education  Suicide Prevention Education:  Patient Refusal for Family/Significant Other Suicide Prevention Education: The patient Frank [name] has refused to provide written consent for family/significant other to be provided Family/Significant Other Suicide Prevention Education during admission and/or prior to discharge.  Physician notified.  Daryel Gerald B 02/28/2015, 3:30 PM

## 2015-02-28 NOTE — Progress Notes (Signed)
D: Patient is alert and oriented x 3. Patient denies SI/HI/AVH. Patient denies pain. This Clinical research associate assessed areas on patient's left leg laceration x 3, areas are clean dry with no drainage. Patient was encouraged to go to evening group, patient stayed in room.  A: Staff to monitor Q 15 mins for safety. Encouragement and support offered. Scheduled medications administered per orders. R: Patient remains safe on the unit. Patient taking administered medications.

## 2015-03-01 NOTE — Progress Notes (Addendum)
D: Patient alert and oriented x 4. Patient denies pain, SI/HI/AVH. Patient refused HS medication stating, "I told the doctor I would not take it, I am in control of my brain now, I wasn't before, but now I am." Patient was pleasant during assessment. Patient went to [place] group, and stated he enjoyed himself.  Three cuts on left leg are clean and dry with no drainage. There is 8,4, and 11 staples.  A: Staff to monitor Q 15 mins for safety. Encouragement and support offered.  R: Patient remains safe on the unit. Patient attended group tonight. Patient visible on the unit.
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
## 2015-03-01 NOTE — Progress Notes (Cosign Needed)
D) Pt has been appropriate and cooperative on approach. Positive for unit activities with minimal prompting. Pt participates appropriately. When not in groups or meals pt tends to isolate to his room. Pt continues to refuse all medications. Pt expressed earlier in shift a  desire to shower but is afraid to dislodge staples. Later pt came to nurses station and said the staple was "stuck on his pants". Writer cut scrub around the staple on upper thigh and new pants were given. Staple is loose on one end and the other end had some bleeding when trying to remove fabric. Pt showed writer another staple further down on his left leg that appears to be loose as well. both areas were cleaned and covered. ARNP notified. A) Level 3 obs for safety, support and encouragement provided. Wound care as needed. Med ed reinforced. R) Cooperative.

## 2015-03-01 NOTE — [facility] Group Notes (Signed)
[facility] LCSW Aftercare Discharge Planning Group Note   03/01/2015 12:19 PM  Participation Quality:  Engaged  Mood/Affect:  Appropriate  Depression Rating:  denies  Anxiety Rating:  denies  Thoughts of Suicide:  No Will you contract for safety?   NA  Current AVH:  No  Plan for Discharge/Comments:   Refused meds last night.  Says not only does he not need them, but he doesn't like the side effects, and will not take any.  Knows that this will probably result in longer stay, but is OK with that.  Sleep good.  Appetite good.  Transportation Means:   Supports:  Daryel Gerald B

## 2015-03-01 NOTE — Clinical Social Work Note (Signed)
Contacted [employer].  Asked for a courtesy visit to pt's mother Xavion Muscat at [address] asking her to call CSW about patient.

## 2015-03-01 NOTE — [facility] Group Notes (Signed)
[facility] LCSW Group Therapy  03/01/2015  1:05 PM  Type of Therapy:  Group therapy  Participation Level:  Active  Participation Quality:  Attentive  Affect:  Flat  Cognitive:  Oriented  Insight:  Limited  Engagement in Therapy:  Limited  Modes of Intervention:  Discussion, Socialization  Summary of Progress/Problems:  Chaplain was here to lead a group on themes of hope and courage. " I'm hoping to get back to my life again, to get back to normal.  I guess I just need to take small steps. The next step is to get into the shelter so I have a place to stay."  "I enjoy listening to music.  It helps me relax and gives me strength."  Daryel Gerald B 03/01/2015 1:40 PM

## 2015-03-01 NOTE — Progress Notes (Signed)
Pt attended karaoke group this evening.  

## 2015-03-01 NOTE — Progress Notes (Signed)
The focus of this group is to help patients review their daily goal of treatment and discuss progress on daily workbooks. Pt attended the evening group session and responded to all discussion prompts from the Writer. Pt shared that today was a good day on the unit, the highlight of which were three surprise visitors earlier this evening. "It was good to see them. I know they care about me." Frank [name] did mention he was hopeful he would be able to have the staples in his leg removed soon as they have been an issue for him today. Pt was encouraged to speak with his RN regarding this. He reported having no additional needs from Nursing Staff this evening. Pt's affect was appropriate.

## 2015-03-02 MED ORDER — OLANZAPINE 5 MG PO TABS: 5.0000 mg | ORAL_TABLET | Freq: Once | ORAL | Status: AC
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
## 2015-03-02 NOTE — Plan of Care (Signed)
Problem: Diagnosis: Increased Risk For Suicide Attempt Goal: STG-Patient Will Comply With Medication Regime Outcome: Not Progressing Patient refused scheduled medication, reports that the medicine makes him feel funny

## 2015-03-02 NOTE — Progress Notes (Signed)
Writer spoke with patient 1:1 and he reports having had a good day and has had visits from his family today which made his day even better. He has been up and active on the unit, attended group this evening but refused his scheduled medication. He reports that he took some medications over at the ED and didn't like how it made him feel so he decided that he does not need the medication. He denies si/hi/a/v hallucinations. He is concerned about when his staples will be removed. Support and encouragement given, safety maintained on unit with 15 min checks.

## 2015-03-02 NOTE — [facility] Suicide Risk Assessment (Signed)
[facility] INPATIENT:  Family/Significant Other Suicide Prevention Education  Suicide Prevention Education:  Education Completed; mother Frank [name] [phone] has been identified by the patient as the family member/significant other with whom the patient will be residing, and identified as the person(s) who will aid the patient in the event of a mental health crisis (suicidal ideations/suicide attempt).  With written consent from the patient, the family member/significant other has been provided the following suicide prevention education, prior to the and/or following the discharge of the patient.  The suicide prevention education provided includes the following:  Suicide risk factors  Suicide prevention and interventions  National Suicide Hotline telephone number  [facility] assessment telephone number  [facility] Emergency Assistance 911  [facility] and/or Residential Mobile Crisis Unit telephone number  Request made of family/significant other to:  Remove weapons (e.g., guns, rifles, knives), all items previously/currently identified as safety concern.    Remove drugs/medications (over-the-counter, prescriptions, illicit drugs), all items previously/currently identified as a safety concern.  The family member/significant other verbalizes understanding of the suicide prevention education information provided.  The family member/significant other agrees to remove the items of safety concern listed above.  Frank [name] 03/02/2015, 3:49 PM

## 2015-03-02 NOTE — Progress Notes (Signed)
Patient ID: Frank [name], male   DOB: Feb 28, 1991, 24 y.o.   MRN: [id number]   D: Pt has been very disorganized on the unit, he has also continued to laugh inappropriately. Pt reported that the doctor told him that he was [name] to be discharged and that he did not need to take his medication. Pt was confronted by the doctor and was told that he needed to take his medication. Pt agreed to take all medication. Pt reported that his depression was a 0, his hopelessness was a 0, and that his anxiety was a 0. Pt reported that his goal for today was to get his staples removed.    Pt reported being negative SI/HI, no AH/VH noted. A: 15 min checks continued for patient safety. R: Pt safety maintained.

## 2015-03-02 NOTE — Plan of Care (Signed)
Problem: Diagnosis: Increased Risk For Suicide Attempt Goal: STG-Patient Will Attend All Groups On The Unit Outcome: Progressing Patient attended evening wrap up group and participated.     

## 2015-03-02 NOTE — Progress Notes (Signed)
Patient ID: Frank [name], male   DOB: [birth year], 24 y.o.   MRN: [id number]   Per doctors orders 11 staples were removed from patients left thigh, 4 staples from upper left leg, and 9 staples from patients lower left leg. All edges approximated, no redness or swelling noted, steri strips applied.
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
## 2015-03-02 NOTE — Progress Notes (Signed)
Pt stated that he had a good day. He had visitors and got his staples taken out. His goal for tomorrow is to continue to eat more.

## 2015-03-02 NOTE — Progress Notes (Signed)
Writer has observed patient sitting up in the dayroom watching tv and interacting with select peers. He reports having had a good day and had family to visit him earlier. He reported that he was glad to have his stapes removed and writer looked at his left leg where stri strips were placed. Writer reminded patient about his scheduled hs medication and he reported that he took medicines earlier and it made him sleepy and he did not want more. Writer noticed that his 2200 zyprexa was given around 11:30 and he reported that he did not want more. Writer received an order not to give his hs dose since he received it earlier. He denies si/hi/a/v hallucinations. Support and encouragement given, safety maintained on unit with 15 min checks.

## 2015-03-03 NOTE — Progress Notes (Signed)
Writer spoke with patient 1:1 and he was observed in his room writing in his journal. He reports that he was writing his thoughts down. Writer informed him about his scheduled medication and he reported that he did not need any medication, "I'm fine thanks." he inquired about discharging on tomorrow and he reported that he is supposed to discharge on tomorrow. Writer asked who informed him of this and he reported that he couldn't remember there name but was told he was discharging on tomorrow. Writer encouraged him to speak with his doctor on tomorrow because writer is not aware of this. He currently denies si/hi/a/v hallucinations. He did not attend group this evening and has been isolative to his room. Encouraged him to come to dayroom and he reported that he was fine. Safety maintained on unit with 15 min checks.
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
## 2015-03-03 NOTE — Progress Notes (Signed)
Patient ID: Frank [name], male   DOB: Apr 11, 1991, 24 y.o.   MRN: [id number]   D: Pt has been very disorganized on the unit, he has also continued to laugh inappropriately. Pt reported that his depression was a 0, his hopelessness was a 0, and that his anxiety was a 0. Pt reported that his goal for today was to get hisself ready for discharge. Pt has refused all medications today on the unit, pt reported that he was good and that he did not need. Pt reported being negative SI/HI, no AH/VH noted. A: 15 min checks continued for patient safety. R: Pt safety maintained.

## 2015-03-03 NOTE — [facility] Group Notes (Signed)
[facility] Group Notes:  (Nursing/MHT/Case Management/Adjunct)  Date:  03/03/2015  Time:  11:03 AM  Type of Therapy:  Psychoeducational Skills  Participation Level:  Active  Participation Quality:  Appropriate  Affect:  Appropriate  Cognitive:  Appropriate  Insight:  Appropriate  Engagement in Group:  Engaged  Modes of Intervention:  Discussion  Summary of Progress/Problems: Pt did attend self inventory group, pt reported that he was negative SI/HI, no AH/VH noted. Pt rated his depression as a 0, and his helplessness/hopelessness as a 0.     Pt reported no issues or concerns. Pt reported that his healthy support system was his family.    Jacquelyne Balint Shanta 03/03/2015, 11:03 AM

## 2015-03-03 NOTE — [facility] Group Notes (Signed)
[facility] Group Notes:  (Clinical Social Work)  03/03/2015  [facility] Group Notes:  (Clinical Social Work)  03/03/2015  11:00AM-12:00PM  Summary of Progress/Problems:  The main focus of today's process group was to listen to a variety of genres of music and to identify that different types of music provoke different responses.  The patient then was able to identify personally what was soothing for them, as well as energizing.   The patient expressed understanding of concepts, as well as knowledge of how each type of music affected him and how this can be used at home as a wellness/recovery tool.  He particularly enjoyed the [place] African music in french that was played for him specifically.  He smiled throughout group consistently, incongruent affect at times.  Type of Therapy:  Music Therapy   Participation Level:  Active  Participation Quality:  Attentive   Affect:  Not Congruent  Cognitive:  Oriented  Insight:  Improving  Engagement in Therapy:  Engaged  Modes of Intervention:   Activity, Exploration  Ambrose Mantle, LCSW 03/03/2015

## 2015-03-03 NOTE — Progress Notes (Signed)
Did not attend group 
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
## 2015-03-04 NOTE — Plan of Care (Signed)
Problem: Ineffective individual coping Goal: STG: Patient will remain free from self harm Outcome: Progressing Pt has demonstrated no self harm behavior on this writer's shift. Goal: STG: Patient will participate in after care plan Outcome: Progressing Pt is attending groups and interacting in the [place].

## 2015-03-04 NOTE — [facility] Group Notes (Signed)
[facility] LCSW Aftercare Discharge Planning Group Note   03/04/2015 11:10 AM  Participation Quality:  Good    Mood/Affect:  Appropriate  Depression Rating:    Anxiety Rating:    Thoughts of Suicide:  No Will you contract for safety?   NA  Current AVH:  No  Plan for Discharge/Comments:  Patient was smiling and appropriate during group. He reports visits from his mom and 2 sisters over the weekend. They all agree to plans for him not to dc to a shelter but to return home with mom who plans to relocate him out of [place].   Transportation Means:   Supports:  Liliana Cline

## 2015-03-04 NOTE — [facility] Group Notes (Signed)
[facility] LCSW Group Therapy  03/04/2015 2:35 PM  Type of Therapy:  Group Therapy  Participation Level:  Active  Participation Quality:  Appropriate  Affect:  Appropriate  Cognitive:  Alert, Appropriate and Lacking  Insight:  Poor and Resistant  Engagement in Therapy:  Developing/Improving  Modes of Intervention:  Clarification, Education, Exploration and Support  Summary of Progress/Problems: Patient has been resistant and refusing meds. Over the weekend, he reports he did agree to take the meds, but reports it made him sleepy and thus,he does not want to take. CSW educated him on side effects with meds and encouraged him to discuss with MD and consider taking before bedtime. Patient remains resistant and against long term medication plans but appears to be willing to consider.  Patient wants to resume his independence and hopes to "change ways". He plans to dc to mothers home at dc and "may sleep on living room floor". He is hoping to "relax" when he gets home. He was unable to identify any barriers to to reaching goals.  Today's Topic: Overcoming Obstacles. Patients identified one short term goal and potential obstacles in reaching this goal. Patients processed barriers involved in overcoming these obstacles. Patients identified steps necessary for overcoming these obstacles and explored motivation (internal and external) for facing these difficulties head on. Today's Topic: Overcoming Obstacles. Patients identified one short term goal and potential obstacles in reaching this goal. Patients processed barriers involved in overcoming these obstacles. Patients identified steps necessary for overcoming these obstacles and explored motivation (internal and external) for facing these difficulties head on.         Liliana Cline 03/04/2015, 2:35 PM
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
## 2015-03-04 NOTE — Progress Notes (Signed)
Pt is wanting to leave and states that his family is upset because they wanted him home today. Pt says that he talked with the doctor and is leaving in the morning. Explained to pt that the doctor needed to assess him in the morning before he could be discharged and explained that he needed to call his family after he had spoken with with the MD. Safety maintained on the unit.

## 2015-03-05 DIAGNOSIS — F22 Delusional disorders: Secondary | ICD-10-CM

## 2015-03-05 NOTE — Progress Notes (Signed)
Patient ID: Frank [name], male   DOB: November 08, 1990, 24 y.o.   MRN: [id number] D: mostly isolative since beginning of shift. Continues to be somewhat Anxious and eager for D/C. Cooperative with assessment. States he is doing ok and denies SI/HI/AVH and contracts for safety. Denies pain.  A: Support and encouragement provided. Refused HS meds as he has for the last several nights. Discussed issues that may complicate his D/C in the AM (labs). Encouraged pt to allow [place] to draw labs in the morning as ordered. Pt was agreeable. Safety has been maintained with q15 minute obs. He offered no questions or concerns otherwise.  R: Pt remains safe on the unit. Will continue q15 min obs for safety and current POC.

## 2015-03-05 NOTE — Progress Notes (Signed)
The focus of this group is to help patients review their daily goal of treatment and discuss progress on daily workbooks. Pt did not attend the evening group. 

## 2015-03-05 NOTE — Tx Team (Signed)
Interdisciplinary Treatment Plan Update (Adult)  Date:  03/05/2015   Time Reviewed:  8:24 AM   Progress in Treatment: Attending groups: Yes. Participating in groups:  Yes. Taking medication as prescribed:  Yes. Tolerating medication:  Yes. Family/Significant othe contact made:  No Patient understands diagnosis:  Yes  As evidenced by seeking help with "racing thoughts" Discussing patient identified problems/goals with staff:  Yes, see initial care plan. Medical problems stabilized or resolved:  Yes. Denies suicidal/homicidal ideation: Yes. Issues/concerns per patient self-inventory:  No. Other:  New problem(s) identified:    Discharge Plan or Barriers: return home, follow up outpt  Reason for Continuation of Hospitalization:   Comments:    Estimated length of stay: Likely D/C tomorrow  New goal(s):  Review of initial/current patient goals per problem list:   Review of initial/current patient goals per problem list:  1. Goal(s): Patient will participate in aftercare plan   Met: Yes   Target date: 3-5 days post admission date   As evidenced by: Patient will participate within aftercare plan AEB aftercare provider and housing plan at discharge being identified.  02/28/15: Pt states he will stay in the homeless shelter and follow up outpt.   03/05/2015  Since locating family, plan has changed to pt returning home, following up outpt   2. Goal (s): Patient will exhibit decreased depressive symptoms and suicidal ideations.   Met: Yes   Target date: 3-5 days post admission date   As evidenced by: Patient will utilize self rating of depression at 3 or below and demonstrate decreased signs of depression or be deemed stable for discharge by MD.  02/28/15:  Pt denies depression and SI today.  "I'm in control now."  5. Goal(s): Patient will demonstrate decreased signs of psychosis  * Met: Yes  * Target date: 3-5 days post admission date  * As evidenced by: Patient  will demonstrate decreased frequency of AVH or return to baseline function  02/28/2015: Pt admits to poor sleep due to "racing thoughts"  Is reluctant to take meds 03/05/2015   Pt denies any psychosis or racing thoughts.  Still refusing meds.    Attendees: Patient:  03/05/2015 8:24 AM   Family:   03/05/2015 8:24 AM   Physician:  Hampton Abbot, MD 03/05/2015 8:24 AM   Nursing:   Keane Police, RN 03/05/2015 8:24 AM   CSW:    Roque Lias, LCSW   03/05/2015 8:24 AM   Other:  03/05/2015 8:24 AM   Other:   03/05/2015 8:24 AM   Other:  Lars Pinks, Nurse CM 03/05/2015 8:24 AM   Other:  Lucinda Dell, [facility] 03/05/2015 8:24 AM   Other:  Norberto Sorenson, [place]  03/05/2015 8:24 AM   Other:  03/05/2015 8:24 AM   Other:  03/05/2015 8:24 AM   Other:  03/05/2015 8:24 AM   Other:  03/05/2015 8:24 AM   Other:  03/05/2015 8:24 AM   Other:   03/05/2015 8:24 AM    Scribe for Treatment Team:   Trish Mage, 03/05/2015 8:24 AM

## 2015-03-05 NOTE — Progress Notes (Signed)
Adult Psychoeducational Group Note  Date:  03/05/2015 Time:09:15am  Group Topic/Focus:  Recovery Goals:   The focus of this group is to identify appropriate goals for recovery and establish a plan to achieve them.  Participation Level:  Active  Participation Quality:  Appropriate and Attentive  Affect:  Anxious  Cognitive:  Alert and Oriented  Insight: Lacking  Engagement in Group:  Defensive and Engaged  Modes of Intervention:  Activity, Education, Orientation, Socialization and Support  Additional Comments:  Pt able to identify a long term goal of "going back to my routine, back to school, my part time job and exercising."  Aurora Mask 03/05/2015, 10:28 AM

## 2015-03-05 NOTE — Progress Notes (Signed)
Pt was observed sitting in the dayroom at the beginning of the shift watching TV.  He reports he had a good day is is looking forward to being discharged tomorrow.  The previous RN reported that the MD has not definitely decided to d/c the pt tomorrow as he is not taking any medications.  He states he does not want to take any medicine as he does not feel he needs it.  He reports his mother is going to take him home with her to live and he is ok with that plan.  Pt was again reminded to not call his family to come get him until he speaks with the MD in the AM.  Pt denies SI/HI/AVH.  He has been polite and appropriate this evening although he has been observed laughing nervously without reason.  He was encouraged to make his needs and concerns known to staff.  Support and encouragement offered.  Safety maintained with q15 minute checks.

## 2015-03-05 NOTE — Progress Notes (Signed)
D. Pt is concerned about his discharge from the hospital today. Pt refused all his medications. Pt reported that his depression was a 0, his hopelessness was a 0, and that his anxiety was a 0. Pt reported being negative SI/HI, no AH/VH noted. Pt reported his goal for today is to be discharged. A: Pt encouraged to take his medication, 15 min checks continued for patient safety. R: Pts safety maintained.
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
## 2015-03-05 NOTE — [facility] Group Notes (Signed)
[facility] LCSW Group Therapy  03/05/2015 , 2:37 PM   Type of Therapy:  Group Therapy  Participation Level:  Active  Participation Quality:  Attentive  Affect:  Appropriate  Cognitive:  Alert  Insight:  Improving  Engagement in Therapy:  Engaged  Modes of Intervention:  Discussion, Exploration and Socialization  Summary of Progress/Problems: Today's group focused on the term Diagnosis.  Participants were asked to define the term, and then pronounce whether it is a negative, positive or neutral term.  Frank [name] is willing to admit that something happened to him prior to admission that was out of the ordinary, but he continues to insist he is fine now, and that he will take no medication while here.  He talked of his time here in fond terms, talking about how he has benefited from being with the staff and other patients.  Talked about the good care he got for his wounds, and how he has enjoyed establishing friendships with some of the patients he has gotten to know.  Frank [name] B 03/05/2015 , 2:37 PM

## 2015-03-06 DIAGNOSIS — F200 Paranoid schizophrenia: Secondary | ICD-10-CM

## 2015-03-06 LAB — CBC WITH DIFFERENTIAL/PLATELET
BASOS ABS: 0 10*3/uL (ref 0.0–0.1)
Basophils Relative: 1 % (ref 0–1)
EOS ABS: 0.2 10*3/uL (ref 0.0–0.7)
Eosinophils Relative: 5 % (ref 0–5)
HEMATOCRIT: 42.2 % (ref 39.0–52.0)
Hemoglobin: 14.2 g/dL (ref 13.0–17.0)
LYMPHS PCT: 57 % — AB (ref 12–46)
Lymphs Abs: 2 10*3/uL (ref 0.7–4.0)
MCH: 28.7 pg (ref 26.0–34.0)
MCHC: 33.6 g/dL (ref 30.0–36.0)
MCV: 85.4 fL (ref 78.0–100.0)
Monocytes Absolute: 0.2 10*3/uL (ref 0.1–1.0)
Monocytes Relative: 6 % (ref 3–12)
NEUTROS PCT: 31 % — AB (ref 43–77)
Neutro Abs: 1.1 10*3/uL — ABNORMAL LOW (ref 1.7–7.7)
Platelets: 214 10*3/uL (ref 150–400)
RBC: 4.94 MIL/uL (ref 4.22–5.81)
RDW: 12.9 % (ref 11.5–15.5)
WBC: 3.5 10*3/uL — ABNORMAL LOW (ref 4.0–10.5)

## 2015-03-06 MED ORDER — HYDROXYZINE HCL 25 MG PO TABS: 25.0000 mg | ORAL_TABLET | Freq: Three times a day (TID) | ORAL | Status: AC | PRN

## 2015-03-06 MED ORDER — OLANZAPINE 5 MG PO TBDP: 5.0000 mg | ORAL_TABLET | Freq: Every day | ORAL | Status: AC
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
## 2015-03-06 NOTE — Progress Notes (Signed)
Patient ID: Frank [name], male   DOB: 11-22-1990, 24 y.o.   MRN: [id number]  Pt currently presents with a flat affect and guarded behavior. Pt states "I'm [name] home today, the doctor spoke with my mother and today will be my last day here." Pt reports decreased sleep last due to "the other guy."  Pt provided with medications per providers orders. Pt's labs and vitals were monitored throughout the day. Pt supported emotionally and encouraged to express concerns and questions. Pt educated on medications.  Pt's safety ensured with 15 minute and environmental checks. Pt currently denies SI/HI and A/V hallucinations. Pt verbally agrees to seek staff if SI/HI or A/VH occurs and to consult with staff before acting on these thoughts. Will continue POC.

## 2015-03-06 NOTE — Progress Notes (Signed)
Patient ID: Frank [name], male   DOB: [birth year], 24 y.o.   MRN: [id number]   Pt discharged home with his mother. Pt was stale and smiling at the time of discharge. All papers and prescriptions were given and valuables returned. Verbal understanding expressed. Denies SI/HI and A/VH. Pt given opportunity to express concerns and ask questions.
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
## 2015-03-06 NOTE — Progress Notes (Signed)
  [facility] Adult Case Management Discharge Plan :  Will you be returning to the same living situation after discharge:  Yes,  home At discharge, do you have transportation home?: Yes,  family Do you have the ability to pay for your medications: Yes,  not taking any  Release of information consent forms completed and in the chart;  Patient's signature needed at discharge.  Patient to Follow up at: Follow-up Information    Follow up with [facility].   Why:  Go to the walk-in clinic M-F between 8 and 11AM for your hospital follow up appointment   Contact information:   [address] [place]  [phone]      Patient denies SI/HI: Yes,  yes    Safety Planning and Suicide Prevention discussed: Yes,  yes  Have you used any form of tobacco in the last 30 days? (Cigarettes, Smokeless Tobacco, Cigars, and/or Pipes): Yes  Has patient been referred to the Quitline?: Yes, faxed on 03/06/15  Frank [name] 03/06/2015, 3:34 PM
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
